# Patient Record
Sex: Female | Born: 1970 | Race: White | Hispanic: No | Marital: Married | Smoking: Never smoker
Health system: Southern US, Community
[De-identification: ages and names within clinical notes are randomized; demographics above are authoritative.]

## PROBLEM LIST (undated history)

## (undated) DIAGNOSIS — G43909 Migraine, unspecified, not intractable, without status migrainosus: Secondary | ICD-10-CM

## (undated) DIAGNOSIS — K219 Gastro-esophageal reflux disease without esophagitis: Secondary | ICD-10-CM

## (undated) DIAGNOSIS — N3010 Interstitial cystitis (chronic) without hematuria: Secondary | ICD-10-CM

## (undated) HISTORY — PX: TUBAL LIGATION: SHX77

## (undated) HISTORY — PX: CHOLECYSTECTOMY: SHX55

---

## 2006-01-20 ENCOUNTER — Ambulatory Visit: Payer: Self-pay | Admitting: Gastroenterology

## 2006-12-28 ENCOUNTER — Ambulatory Visit: Payer: Self-pay | Admitting: Obstetrics and Gynecology

## 2010-10-17 ENCOUNTER — Ambulatory Visit: Payer: Self-pay | Admitting: Otolaryngology

## 2012-04-12 ENCOUNTER — Ambulatory Visit: Payer: Self-pay | Admitting: Family Medicine

## 2014-05-28 ENCOUNTER — Ambulatory Visit: Payer: Self-pay | Admitting: Family Medicine

## 2015-03-17 ENCOUNTER — Ambulatory Visit
Admission: EM | Admit: 2015-03-17 | Discharge: 2015-03-17 | Disposition: A | Discharge status: Home / Self Care | Payer: BLUE CROSS/BLUE SHIELD | Attending: Family Medicine | Admitting: Family Medicine

## 2015-03-17 ENCOUNTER — Ambulatory Visit (INDEPENDENT_AMBULATORY_CARE_PROVIDER_SITE_OTHER): Payer: BLUE CROSS/BLUE SHIELD

## 2015-03-17 DIAGNOSIS — J40 Bronchitis, not specified as acute or chronic: Secondary | ICD-10-CM

## 2015-03-17 MED ORDER — ALBUTEROL SULFATE HFA 108 (90 BASE) MCG/ACT IN AERS: 1.0000 | INHALATION_SPRAY | Freq: Four times a day (QID) | RESPIRATORY_TRACT | Status: DC | PRN

## 2015-03-17 MED ORDER — HYDROCOD POLST-CPM POLST ER 10-8 MG/5ML PO SUER: 5.0000 mL | Freq: Two times a day (BID) | ORAL | Status: AC | PRN

## 2015-03-17 MED ORDER — PREDNISONE 20 MG PO TABS: ORAL_TABLET | ORAL | Status: DC

## 2015-03-17 NOTE — ED Notes (Signed)
X 1 week. Pt C/o productive cough with clear sputum, SOB, sore throat, H/A, laryngitis, low grade fever. Pt's PCP started pt on prescription cough syrup, Z Pack, and then Omnicef, without relief.

## 2015-03-17 NOTE — ED Provider Notes (Signed)
CSN: [id number]     Arrival date & time 03/17/15  0900 History   First MD Initiated Contact with Patient 03/17/15 1022     Chief Complaint  Patient presents with  . URI   (Consider location/radiation/quality/duration/timing/severity/associated sxs/prior Treatment) HPI patient presents today with symptoms of productive cough, hoarseness, sore throat, headache, low-grade fever. She has experienced some shortness of breath with the coughing. She has been on a Z-Pak. Most recently she started Omnicef 2 days ago. She denies any history of smoking, PE, DVT, cardiac problems. She denies any nausea, chest pain, severe headache, vomiting, diarrhea, abdominal pain. She is in no distress in the office today. She denies any history of COPD or asthma. She has been taking Advil, Pseudoephedrine, Cheratussin for her symptoms.  No past medical history on file. No past surgical history on file. No family history on file. Social History  Substance Use Topics  . Smoking status: Never Smoker   . Smokeless tobacco: Not on file  . Alcohol Use: No   OB History    No data available     Review of Systems: Negative except mentioned above.   Allergies  Review of patient's allergies indicates no known allergies.  Home Medications   Prior to Admission medications   Medication Sig Start Date End Date Taking? Authorizing Provider  amitriptyline (ELAVIL) 50 MG tablet Take 50 mg by mouth at bedtime.   Yes Historical Provider, MD  eletriptan (RELPAX) 40 MG tablet Take 40 mg by mouth as needed for migraine or headache. May repeat in 2 hours if headache persists or recurs.   Yes Historical Provider, MD  pantoprazole (PROTONIX) 20 MG tablet Take 20 mg by mouth daily.   Yes Historical Provider, MD  topiramate (TOPAMAX) 50 MG tablet Take 50 mg by mouth 2 (two) times daily.   Yes Historical Provider, MD   Meds Ordered and Administered this Visit  Medications - No data to display  BP 107/78 mmHg  Pulse 90   Temp(Src) 98.6 F (37 C) (Oral)  Resp 16  Ht 5\' 3"  (1.6 m)  Wt 207 lb (93.895 kg)  BMI 36.68 kg/m2  SpO2 100% No data found.   Physical Exam   GENERAL: NAD HEENT: no pharyngeal erythema, no exudate, no erythema of TMs, no cervical LAD RESP: CTA B, no wheezing, no rhonci  CARD: RRR NEURO: CN II-XII grossly intact   ED Course  Procedures (including critical care time)  Labs Review Labs Reviewed - No data to display  Imaging Review Dg Chest 2 View  03/17/2015  CLINICAL DATA:  44 year old female with cough and fever for 1 week. EXAM: CHEST  2 VIEW COMPARISON:  None. FINDINGS: The cardiomediastinal silhouette is unremarkable. Mild peribronchial thickening noted. There is no evidence of focal airspace disease, pulmonary edema, suspicious pulmonary nodule/mass, pleural effusion, or pneumothorax. No acute bony abnormalities are identified. Cholecystectomy clips identified. IMPRESSION: Mild peribronchial thickening of uncertain chronicity. No evidence of focal pneumonia. Electronically Signed   By: Harmon PierJeffrey  Hu M.D.   On: 03/17/2015 11:21       MDM    A/P: Bronchitis-chest x-ray as above, recommend continuing Omnicef since patient started this 2 days ago, albuterol when necessary, Tussionex when necessary, low-dose prednisone orally (patient has taken prednisone in the past and has had facial flushing and elevation of blood pressure so we will start with a low-dose prednisone). I have asked that she follow up with her primary care physician in the next couple of days so her  prednisone could be increased if needed. If she has any acute problems she is to seek immediate adequate attention as discussed. She can stop the Cheratussin and pseudoephedrine and Advil at this time. I've given her work excuse for 2 days.   Jolene Provost, MD 03/17/15 1150

## 2015-03-31 ENCOUNTER — Emergency Department
Admission: EM | Admit: 2015-03-31 | Discharge: 2015-03-31 | Disposition: A | Discharge status: Home / Self Care | Payer: BLUE CROSS/BLUE SHIELD | Attending: Emergency Medicine | Admitting: Emergency Medicine

## 2015-03-31 ENCOUNTER — Encounter: Payer: Self-pay | Admitting: Emergency Medicine

## 2015-03-31 DIAGNOSIS — G43809 Other migraine, not intractable, without status migrainosus: Secondary | ICD-10-CM | POA: Diagnosis not present

## 2015-03-31 DIAGNOSIS — Z79899 Other long term (current) drug therapy: Secondary | ICD-10-CM | POA: Diagnosis not present

## 2015-03-31 DIAGNOSIS — Z7952 Long term (current) use of systemic steroids: Secondary | ICD-10-CM | POA: Diagnosis not present

## 2015-03-31 DIAGNOSIS — R51 Headache: Secondary | ICD-10-CM | POA: Diagnosis present

## 2015-03-31 HISTORY — DX: Migraine, unspecified, not intractable, without status migrainosus: G43.909

## 2015-03-31 HISTORY — DX: Interstitial cystitis (chronic) without hematuria: N30.10

## 2015-03-31 HISTORY — DX: Gastro-esophageal reflux disease without esophagitis: K21.9

## 2015-03-31 MED ORDER — DEXAMETHASONE SODIUM PHOSPHATE 10 MG/ML IJ SOLN
10.0000 mg | Freq: Once | INTRAMUSCULAR | Status: AC
  Administered 2015-03-31: 10 mg via INTRAVENOUS
  Filled 2015-03-31: qty 1

## 2015-03-31 MED ORDER — METOCLOPRAMIDE HCL 5 MG/ML IJ SOLN
10.0000 mg | Freq: Once | INTRAMUSCULAR | Status: AC
  Administered 2015-03-31: 10 mg via INTRAVENOUS
  Filled 2015-03-31: qty 2

## 2015-03-31 MED ORDER — DIPHENHYDRAMINE HCL 50 MG/ML IJ SOLN
25.0000 mg | Freq: Once | INTRAMUSCULAR | Status: AC
  Administered 2015-03-31: 25 mg via INTRAVENOUS
  Filled 2015-03-31: qty 1

## 2015-03-31 MED ORDER — SODIUM CHLORIDE 0.9 % IV BOLUS (SEPSIS)
500.0000 mL | INTRAVENOUS | Status: AC
  Administered 2015-03-31: 500 mL via INTRAVENOUS

## 2015-03-31 MED ORDER — KETOROLAC TROMETHAMINE 30 MG/ML IJ SOLN
15.0000 mg | Freq: Once | INTRAMUSCULAR | Status: AC
  Administered 2015-03-31: 15 mg via INTRAVENOUS
  Filled 2015-03-31: qty 1

## 2015-03-31 NOTE — ED Provider Notes (Signed)
[facility] Provider Note  ____________________________________________  Time seen: Approximately 11:10 PM  I have reviewed the triage vital signs and the nursing notes.   HISTORY  Chief Complaint Migraine    HPI KLOE OATES is a 45 y.o. female who presents to the ED from home with a chief complaint of migraine headache. Patient has a history of migraine headaches, occurring approximately 5-6 times per month. Patient complains of typical migraine headache 3 days. Describes sharp and throbbing pain behind her right eye. She is taking her migraine medications as prescribed with little to no relief. Complains of associated symptoms of nausea and vomiting 2 as well as photophobia. Denies fever, chills, neck pain, chest pain, shortness of breath, abdominal pain, diarrhea. Denies recent travel or trauma. Nothing makes her headache better or worse.   Past Medical History  Diagnosis Date  . Migraine   . Interstitial cystitis   . GERD (gastroesophageal reflux disease)     There are no active problems to display for this patient.   Past Surgical History  Procedure Laterality Date  . Cholecystectomy    . Tubal ligation      Current Outpatient Rx  Name  Route  Sig  Dispense  Refill  . albuterol (PROVENTIL HFA;VENTOLIN HFA) 108 (90 BASE) MCG/ACT inhaler   Inhalation   Inhale 1-2 puffs into the lungs every 6 (six) hours as needed for wheezing or shortness of breath.   1 Inhaler   0   . amitriptyline (ELAVIL) 50 MG tablet   Oral   Take 50 mg by mouth at bedtime.         Marland Kitchen eletriptan (RELPAX) 40 MG tablet   Oral   Take 40 mg by mouth as needed for migraine or headache. May repeat in 2 hours if headache persists or recurs.         . pantoprazole (PROTONIX) 20 MG tablet   Oral   Take 20 mg by mouth daily.         . predniSONE (DELTASONE) 20 MG tablet      Take one tab PO qd for 5 days.   5 tablet   0   . topiramate  (TOPAMAX) 50 MG tablet   Oral   Take 50 mg by mouth 2 (two) times daily.           Allergies Review of patient's allergies indicates no known allergies.  History reviewed. No pertinent family history.  Social History Social History  Substance Use Topics  . Smoking status: Never Smoker   . Smokeless tobacco: None  . Alcohol Use: No    Review of Systems Constitutional: No fever/chills Eyes: No visual changes. ENT: No sore throat. Cardiovascular: Denies chest pain. Respiratory: Denies shortness of breath. Gastrointestinal: No abdominal pain.  No nausea, no vomiting.  No diarrhea.  No constipation. Genitourinary: Negative for dysuria. Musculoskeletal: Negative for back pain. Skin: Negative for rash. Neurological: Positive for headache. Negative for focal weakness or numbness.  10-point ROS otherwise negative.  ____________________________________________   PHYSICAL EXAM:  VITAL SIGNS: ED Triage Vitals  Enc Vitals Group     BP 03/31/15 2125 142/90 mmHg     Pulse Rate 03/31/15 2125 92     Resp 03/31/15 2125 18     Temp 03/31/15 2125 98.3 F (36.8 C)     Temp Source 03/31/15 2125 Oral     SpO2 03/31/15 2125 100 %     Weight 03/31/15 2125 208 lb (94.348  kg)     Height 03/31/15 2125 5\' 3"  (1.6 m)     Head Cir --      Peak Flow --      Pain Score 03/31/15 2126 10     Pain Loc --      Pain Edu? --      Excl. in GC? --     Constitutional: Alert and oriented. Well appearing and in no acute distress. Eyes: Conjunctivae are normal. PERRL. EOMI. Head: Atraumatic. Nose: No congestion/rhinnorhea. Mouth/Throat: Mucous membranes are moist.  Oropharynx non-erythematous. Neck: No stridor.  No carotid bruits. Neck is supple without evidence for meningismus. Cardiovascular: Normal rate, regular rhythm. Grossly normal heart sounds.  Good peripheral circulation. Respiratory: Normal respiratory effort.  No retractions. Lungs CTAB. Gastrointestinal: Soft and nontender. No  distention. No abdominal bruits. No CVA tenderness. Musculoskeletal: No lower extremity tenderness nor edema.  No joint effusions. Neurologic:  Alert and oriented 4. CN II-XII grossly intact. Normal speech and language. No gross focal neurologic deficits are appreciated. No gait instability. Skin:  Skin is warm, dry and intact. No rash noted. Psychiatric: Mood and affect are normal. Speech and behavior are normal.  ____________________________________________   LABS (all labs ordered are listed, but only abnormal results are displayed)  Labs Reviewed - No data to display ____________________________________________  EKG  None ____________________________________________  RADIOLOGY  None ____________________________________________   PROCEDURES  Procedure(s) performed: None  Critical Care performed: No  ____________________________________________   INITIAL IMPRESSION / ASSESSMENT AND PLAN / ED COURSE  Pertinent labs & imaging results that were available during my care of the patient were reviewed by me and considered in my medical decision making (see chart for details).  45 year old female with recurrent migraine headaches who presents with typical migraine headache. She received an IV headache cocktail prior to my arrival with good relief. Pain has decreased to 3/10. She exhibits no focal neurological deficits on my exam. Strict return precautions given. Patient's spouse verbalize understanding and agree with plan of care. ____________________________________________   FINAL CLINICAL IMPRESSION(S) / ED DIAGNOSES  Final diagnoses:  Other migraine without status migrainosus, not intractable      Irean HongJade J Demian Maisel, MD 04/01/15 [phone]

## 2015-03-31 NOTE — Discharge Instructions (Signed)
1. Continue daily medicines as directed by your doctor. 2. Return to the ER for worsening symptoms, persistent vomiting, lethargy or other concerns.  Recurrent Migraine Headache A migraine headache is an intense, throbbing pain on one or both sides of your head. Recurrent migraines keep coming back. A migraine can last for 30 minutes to several hours. CAUSES  The exact cause of a migraine headache is not always known. However, a migraine may be caused when nerves in the brain become irritated and release chemicals that cause inflammation. This causes pain. Certain things may also trigger migraines, such as:   Alcohol.  Smoking.  Stress.  Menstruation.  Aged cheeses.  Foods or drinks that contain nitrates, glutamate, aspartame, or tyramine.  Lack of sleep.  Chocolate.  Caffeine.  Hunger.  Physical exertion.  Fatigue.  Medicines used to treat chest pain (nitroglycerine), birth control pills, estrogen, and some blood pressure medicines. SYMPTOMS   Pain on one or both sides of your head.  Pulsating or throbbing pain.  Severe pain that prevents daily activities.  Pain that is aggravated by any physical activity.  Nausea, vomiting, or both.  Dizziness.  Pain with exposure to bright lights, loud noises, or activity.  General sensitivity to bright lights, loud noises, or smells. Before you get a migraine, you may get warning signs that a migraine is coming (aura). An aura may include:  Seeing flashing lights.  Seeing bright spots, halos, or zigzag lines.  Having tunnel vision or blurred vision.  Having feelings of numbness or tingling.  Having trouble talking.  Having muscle weakness. DIAGNOSIS  A recurrent migraine headache is often diagnosed based on:  Symptoms.  Physical examination.  A CT scan or MRI of your head. These imaging tests cannot diagnose migraines but can help rule out other causes of headaches.  TREATMENT  Medicines may be given for  pain and nausea. Medicines can also be given to help prevent recurrent migraines. HOME CARE INSTRUCTIONS  Only take over-the-counter or prescription medicines for pain or discomfort as directed by your health care provider. The use of long-term narcotics is not recommended.  Lie down in a dark, quiet room when you have a migraine.  Keep a journal to find out what may trigger your migraine headaches. For example, write down:  What you eat and drink.  How much sleep you get.  Any change to your diet or medicines.  Limit alcohol consumption.  Quit smoking if you smoke.  Get 7-9 hours of sleep, or as recommended by your health care provider.  Limit stress.  Keep lights dim if bright lights bother you and make your migraines worse. SEEK MEDICAL CARE IF:   You do not get relief from the medicines given to you.  You have a recurrence of pain.  You have a fever. SEEK IMMEDIATE MEDICAL CARE IF:  Your migraine becomes severe.  You have a stiff neck.  You have loss of vision.  You have muscular weakness or loss of muscle control.  You start losing your balance or have trouble walking.  You feel faint or pass out.  You have severe symptoms that are different from your first symptoms. MAKE SURE YOU:   Understand these instructions.  Will watch your condition.  Will get help right away if you are not doing well or get worse.   This information is not intended to replace advice given to you by your health care provider. Make sure you discuss any questions you have with your health  care provider.   Document Released: 12/09/2000 Document Revised: 04/06/2014 Document Reviewed: 11/21/2012 Elsevier Interactive Patient Education [employer]2016 Elsevier Inc.

## 2015-03-31 NOTE — ED Notes (Signed)
Pt moved to subwait for IV medication and to wait for treatment room; husband at side; callbell in reach;

## 2015-03-31 NOTE — ED Notes (Addendum)
Pt c/o migraine headache since Friday, behind right eye; pt says she usually gets her migraines behind either eye; taking migraine medications as prescribed with little to no relief; N/V with last time 30 minutes ago; sensitive to lights and sounds

## 2016-05-21 ENCOUNTER — Ambulatory Visit (INDEPENDENT_AMBULATORY_CARE_PROVIDER_SITE_OTHER): Payer: Worker's Compensation | Admitting: Family Medicine

## 2016-05-21 VITALS — BP 122/80 | HR 87 | Temp 98.1°F | Resp 18 | Ht 63.0 in | Wt 214.6 lb

## 2016-05-21 DIAGNOSIS — S0993XA Unspecified injury of face, initial encounter: Secondary | ICD-10-CM | POA: Diagnosis not present

## 2016-05-21 DIAGNOSIS — Z026 Encounter for examination for insurance purposes: Secondary | ICD-10-CM

## 2016-05-21 MED ORDER — NAPROXEN 500 MG PO TABS: 500.0000 mg | ORAL_TABLET | Freq: Two times a day (BID) | ORAL | 0 refills | Status: DC

## 2016-05-21 NOTE — Patient Instructions (Addendum)
Apply ice to chin in 10-20 minutes increments as tolerated.  Take Naproxen 500 mg twice daily as needed with food for pain.  If symptoms worsen, return for care.

## 2016-05-21 NOTE — Progress Notes (Signed)
     Patricia Modenaeri I Costa 08/13/1970 46 y.o.   Chief Complaint  Patient presents with  . Fall    w/c intial. Chin injury     Presents for evaluation of work-related complaint.  Date of Injury: 05/21/16 1030   History of Present Illness: 46 year old female presents for evaluation of injury to the chin which occurred at her workplace: [employer]. Reports injury occurred as follows: Walking outside on walkway between buildings and tripped and fell hitting her chin on uneven pavement. Denies loosing consciousness, light-headedness, headache, or visual disturbances. Reports during injury she bit down on her bottom inner lip slightly causing mild bleeding.  She has not taken any medication. She has applied ice to reduce pain and swelling.  ROS See HPI Current medications and allergies reviewed and updated. Past medical history, family history, social history have been reviewed and updated.  Physical Exam  Constitutional: She is oriented to person, place, and time.  HENT:  Inner lower lip bite mark laceration  No injury to tongue  Cardiovascular: Normal rate, regular rhythm, normal heart sounds and intact distal pulses.   Pulmonary/Chest: Effort normal and breath sounds normal.  Neurological: She is alert and oriented to person, place, and time.     Assessment and Plan:  1. Chin injury, initial encounter 2. Encounter related to worker's compensation claim  Plan: Continue to apply ice as tolerated to reduce swelling and pain. Take Naproxen 500 mg twice daily with food as needed for pain.

## 2017-06-30 ENCOUNTER — Other Ambulatory Visit: Payer: Self-pay | Admitting: Family Medicine

## 2017-06-30 ENCOUNTER — Ambulatory Visit
Admission: RE | Admit: 2017-06-30 | Discharge: 2017-06-30 | Disposition: A | Discharge status: Home / Self Care | Payer: BLUE CROSS/BLUE SHIELD | Source: Ambulatory Visit | Attending: Family Medicine | Admitting: Family Medicine

## 2017-06-30 DIAGNOSIS — M25562 Pain in left knee: Secondary | ICD-10-CM | POA: Diagnosis present

## 2017-07-13 ENCOUNTER — Ambulatory Visit: Payer: BLUE CROSS/BLUE SHIELD | Admitting: Podiatry

## 2017-07-13 ENCOUNTER — Other Ambulatory Visit: Payer: Self-pay | Admitting: Podiatry

## 2017-07-13 ENCOUNTER — Ambulatory Visit (INDEPENDENT_AMBULATORY_CARE_PROVIDER_SITE_OTHER): Payer: BLUE CROSS/BLUE SHIELD

## 2017-07-13 DIAGNOSIS — M722 Plantar fascial fibromatosis: Secondary | ICD-10-CM

## 2017-07-13 DIAGNOSIS — M779 Enthesopathy, unspecified: Secondary | ICD-10-CM | POA: Diagnosis not present

## 2017-07-13 DIAGNOSIS — M7750 Other enthesopathy of unspecified foot: Secondary | ICD-10-CM

## 2017-07-13 DIAGNOSIS — M79672 Pain in left foot: Secondary | ICD-10-CM

## 2017-07-13 MED ORDER — MELOXICAM 7.5 MG PO TABS: 7.5000 mg | ORAL_TABLET | Freq: Every day | ORAL | 0 refills | Status: DC

## 2017-07-13 NOTE — Patient Instructions (Signed)

## 2017-07-14 NOTE — Progress Notes (Signed)
Subjective:   Patient ID: Patricia Costa, female   DOB: 47 y.o.   MRN: [id number]   HPI 47 year old female presents the office today for concerns of bilateral foot pain in the left foot worse than the right.  She says is been ongoing for several years been getting worse over the last couple of months.  She gets pain to the foot in the morning she first gets up or after activity.  Pain is intermittent but is on a daily basis.  She denies any recent injury or trauma.  She has previously been treated by our office for plantar fasciitis.  She denies any recent injury or trauma to the area.  She is been taking ibuprofen.  She is a remote history of fracturing both feet about 20 years ago.  She has no other concerns today.   Review of Systems  All other systems reviewed and are negative.  Past Medical History:  Diagnosis Date  . GERD (gastroesophageal reflux disease)   . Interstitial cystitis   . Migraine     Past Surgical History:  Procedure Laterality Date  . CHOLECYSTECTOMY    . TUBAL LIGATION       Current Outpatient Medications:  .  albuterol (PROVENTIL HFA;VENTOLIN HFA) 108 (90 BASE) MCG/ACT inhaler, Inhale 1-2 puffs into the lungs every 6 (six) hours as needed for wheezing or shortness of breath., Disp: 1 Inhaler, Rfl: 0 .  amitriptyline (ELAVIL) 50 MG tablet, Take 50 mg by mouth at bedtime., Disp: , Rfl:  .  eletriptan (RELPAX) 40 MG tablet, Take 40 mg by mouth as needed for migraine or headache. May repeat in 2 hours if headache persists or recurs., Disp: , Rfl:  .  naproxen (NAPROSYN) 500 MG tablet, Take 1 tablet (500 mg total) by mouth 2 (two) times daily with a meal., Disp: 30 tablet, Rfl: 0 .  omeprazole (PRILOSEC) 40 MG capsule, Take 40 mg by mouth daily., Disp: , Rfl:  .  topiramate (TOPAMAX) 50 MG tablet, Take 50 mg by mouth 2 (two) times daily., Disp: , Rfl:  .  meloxicam (MOBIC) 7.5 MG tablet, Take 1 tablet (7.5 mg total) by mouth daily., Disp: 30 tablet, Rfl: 0  No Known  Allergies  Social History   Socioeconomic History  . Marital status: Married    Spouse name: Not on file  . Number of children: Not on file  . Years of education: Not on file  . Highest education level: Not on file  Occupational History  . Not on file  Social Needs  . Financial resource strain: Not on file  . Food insecurity:    Worry: Not on file    Inability: Not on file  . Transportation needs:    Medical: Not on file    Non-medical: Not on file  Tobacco Use  . Smoking status: Never Smoker  . Smokeless tobacco: Never Used  Substance and Sexual Activity  . Alcohol use: No  . Drug use: Not on file  . Sexual activity: Not on file  Lifestyle  . Physical activity:    Days per week: Not on file    Minutes per session: Not on file  . Stress: Not on file  Relationships  . Social connections:    Talks on phone: Not on file    Gets together: Not on file    Attends religious service: Not on file    Active member of club or organization: Not on file    Attends meetings  of clubs or organizations: Not on file    Relationship status: Not on file  . Intimate partner violence:    Fear of current or ex partner: Not on file    Emotionally abused: Not on file    Physically abused: Not on file    Forced sexual activity: Not on file  Other Topics Concern  . Not on file  Social History Narrative  . Not on file        Objective:  Physical Exam  General: AAO x3, NAD  Dermatological: Skin is warm, dry and supple bilateral. Nails x 10 are well manicured; remaining integument appears unremarkable at this time. There are no open sores, no preulcerative lesions, no rash or signs of infection present.  Vascular: Dorsalis Pedis artery and Posterior Tibial artery pedal pulses are 2/4 bilateral with immedate capillary fill time. Pedal hair growth present. No varicosities and no lower extremity edema present bilateral. There is no pain with calf compression, swelling, warmth, erythema.    Neruologic: Grossly intact via light touch bilateral. Vibratory intact via tuning fork bilateral. Protective threshold with Semmes Wienstein monofilament intact to all pedal sites bilateral. Patellar and Achilles deep tendon reflexes 2+ bilateral. No Babinski or clonus noted bilateral.   Musculoskeletal: At this time there is no tenderness palpation of the plantar medial tubercle of the calcaneus and insertion of the plantar fascia bilaterally.  On the left side there is mild discomfort on the course of the flexor, posterior tibial tendon.  She is able to do a single and double heel rise without any issues.  There is also mild discomfort more to the dorsal lateral aspect of the left foot and ankle.  There is no pain along the ATFL, CFL or PTFL.  Minimal discomfort on the sinus tarsi.  Mild decrease in medial arch upon weightbearing.  Mild tenderness on the dorsal aspect of the midfoot on the right side but there is no specific area pinpoint bony tenderness or pain to vibratory sensation.  Achilles tendon appears to be intact.  Muscular strength 5/5 in all groups tested bilateral.  Gait: Unassisted, Nonantalgic.       Assessment:   47 year old female with bilateral tendinitis, capsulitis left side worse than the right    Plan:  -Treatment options discussed including all alternatives, risks, and complications -Etiology of symptoms were discussed -X-rays were obtained and reviewed with the patient.  No definitive evidence of acute fracture or stress fracture.  Mild arthritic changes present in the midfoot on the right side on the lateral view. -Prescribed mobic. Discussed side effects of the medication and directed to stop if any are to occur and call the office.  -Stretching, icing exercises daily. -We discussed a custom molded orthotic.  I will have her reappointed to see Raiford Noble to get molded for orthotics. -Discussed shoe modifications  Vivi Barrack DPM

## 2017-07-20 ENCOUNTER — Telehealth: Payer: Self-pay | Admitting: Podiatry

## 2017-07-20 NOTE — Telephone Encounter (Signed)
lvm for pt that [employer] is having system updates for the last 2 days and cannot give me coverage information and I am out of the office as of tomorrow afternoon and will call next week to verify benefits.

## 2017-07-26 NOTE — Telephone Encounter (Signed)
Per Valentina Gu @ [employer] [phone]) are covered at 100% if billed with office visit and copay is paid..  Left message for pt to call to confirm she wants to proceed with ordering the orthotics.Marland Kitchen

## 2017-07-27 NOTE — Telephone Encounter (Signed)
Pt called and benefits for orthotics given to pt and she would like to proceed with ordering them.

## 2017-07-27 NOTE — Telephone Encounter (Signed)
Thanks, can you please make sure the charges are entered? Thank you.

## 2017-08-12 ENCOUNTER — Encounter: Payer: Self-pay | Admitting: Podiatry

## 2017-08-12 ENCOUNTER — Ambulatory Visit: Payer: BLUE CROSS/BLUE SHIELD | Admitting: Podiatry

## 2017-08-12 DIAGNOSIS — M722 Plantar fascial fibromatosis: Secondary | ICD-10-CM | POA: Diagnosis not present

## 2017-08-12 MED ORDER — METHYLPREDNISOLONE 4 MG PO TBPK: ORAL_TABLET | ORAL | 0 refills | Status: DC

## 2017-08-12 NOTE — Patient Instructions (Signed)

## 2017-08-13 DIAGNOSIS — M722 Plantar fascial fibromatosis: Secondary | ICD-10-CM | POA: Insufficient documentation

## 2017-08-13 NOTE — Progress Notes (Signed)
Subjective: 47 year old female presents the office today for follow-up evaluation of left foot plantar fasciitis and to pick up orthotics.  She states that her foot is doing okay but she still getting tenderness in the heels like the wound when she gets up or after activity.  Denies any recent injury or trauma.  She did have one day of swelling last week with a resolved.  No redness or warmth.  No recent injury. Denies any systemic complaints such as fevers, chills, nausea, vomiting. No acute changes since last appointment, and no other complaints at this time.   Objective: AAO x3, NAD DP/PT pulses palpable bilaterally, CRT less than 3 seconds There is continuation of tenderness to palpation on the plantar medial tubercle of the calcaneus at the insertion of plantar fascia on the left side.  There is no pain on the course of the plantar fascia today.  There is no pain on the posterior aspect of the calcaneus or along the Achilles tendon.  There is no pain to the ankle.  Flexor, extensor tendons appear to be intact.  Achilles tendon intact.  There is no significant edema, erythema, increase in warmth identified today. No open lesions or pre-ulcerative lesions.  No pain with calf compression, swelling, warmth, erythema  Assessment: Left foot plantar fasciitis  Plan: -All treatment options discussed with the patient including all alternatives, risks, complications.  -The orthotics are ready but upon evaluation of your fitting into her tennis shoes which she states she does not wear with anyone for address pair purulence in the spectrum of a dress orthotic made for her.  I dispensed a plantar fascial brace today.  Medrol Dosepak was prescribed.  Continue stretching, icing exercises daily.  Declines steroid injection. -Patient encouraged to call the office with any questions, concerns, change in symptoms.   Vivi Barrack DPM

## 2017-09-02 ENCOUNTER — Ambulatory Visit: Payer: BLUE CROSS/BLUE SHIELD | Admitting: Orthotics

## 2017-09-02 DIAGNOSIS — M722 Plantar fascial fibromatosis: Secondary | ICD-10-CM

## 2017-09-02 DIAGNOSIS — M779 Enthesopathy, unspecified: Secondary | ICD-10-CM

## 2017-09-02 NOTE — Progress Notes (Signed)
Patient came in today to pick up custom made foot orthotics.  The goals were accomplished and the patient reported no dissatisfaction with said orthotics.  Patient was advised of breakin period and how to report any issues. 

## 2018-01-25 ENCOUNTER — Other Ambulatory Visit: Payer: Self-pay | Admitting: Family Medicine

## 2018-01-25 DIAGNOSIS — Z1231 Encounter for screening mammogram for malignant neoplasm of breast: Secondary | ICD-10-CM

## 2018-02-08 ENCOUNTER — Ambulatory Visit
Admission: RE | Admit: 2018-02-08 | Discharge: 2018-02-08 | Disposition: A | Discharge status: Home / Self Care | Payer: BLUE CROSS/BLUE SHIELD | Source: Ambulatory Visit | Attending: Family Medicine | Admitting: Family Medicine

## 2018-02-08 DIAGNOSIS — Z1231 Encounter for screening mammogram for malignant neoplasm of breast: Secondary | ICD-10-CM | POA: Insufficient documentation

## 2019-03-21 ENCOUNTER — Other Ambulatory Visit: Payer: Self-pay | Admitting: Family Medicine

## 2019-03-21 DIAGNOSIS — Z1231 Encounter for screening mammogram for malignant neoplasm of breast: Secondary | ICD-10-CM

## 2019-03-29 ENCOUNTER — Ambulatory Visit
Admission: RE | Admit: 2019-03-29 | Discharge: 2019-03-29 | Disposition: A | Discharge status: Home / Self Care | Payer: BC Managed Care – PPO | Source: Ambulatory Visit | Attending: Family Medicine | Admitting: Family Medicine

## 2019-03-29 DIAGNOSIS — Z1231 Encounter for screening mammogram for malignant neoplasm of breast: Secondary | ICD-10-CM | POA: Insufficient documentation

## 2020-04-02 ENCOUNTER — Other Ambulatory Visit: Payer: Self-pay | Admitting: Family Medicine

## 2020-04-02 DIAGNOSIS — Z1231 Encounter for screening mammogram for malignant neoplasm of breast: Secondary | ICD-10-CM

## 2020-04-05 ENCOUNTER — Other Ambulatory Visit: Payer: Self-pay

## 2020-04-05 ENCOUNTER — Ambulatory Visit
Admission: RE | Admit: 2020-04-05 | Discharge: 2020-04-05 | Disposition: A | Discharge status: Home / Self Care | Payer: BC Managed Care – PPO | Source: Ambulatory Visit | Attending: Family Medicine | Admitting: Family Medicine

## 2020-04-05 DIAGNOSIS — Z1231 Encounter for screening mammogram for malignant neoplasm of breast: Secondary | ICD-10-CM | POA: Insufficient documentation

## 2022-02-17 IMAGING — MG DIGITAL SCREENING BILAT W/ TOMO W/ CAD
8 series · 8 of 24 positions shown · non-contrast
Comparison: Previous exam(s).

ACR Breast Density Category a: The breast tissue is almost entirely
fatty.

CLINICAL DATA: Screening.

EXAM:
DIGITAL SCREENING BILATERAL MAMMOGRAM WITH TOMO AND CAD

[L CC synth-2D]
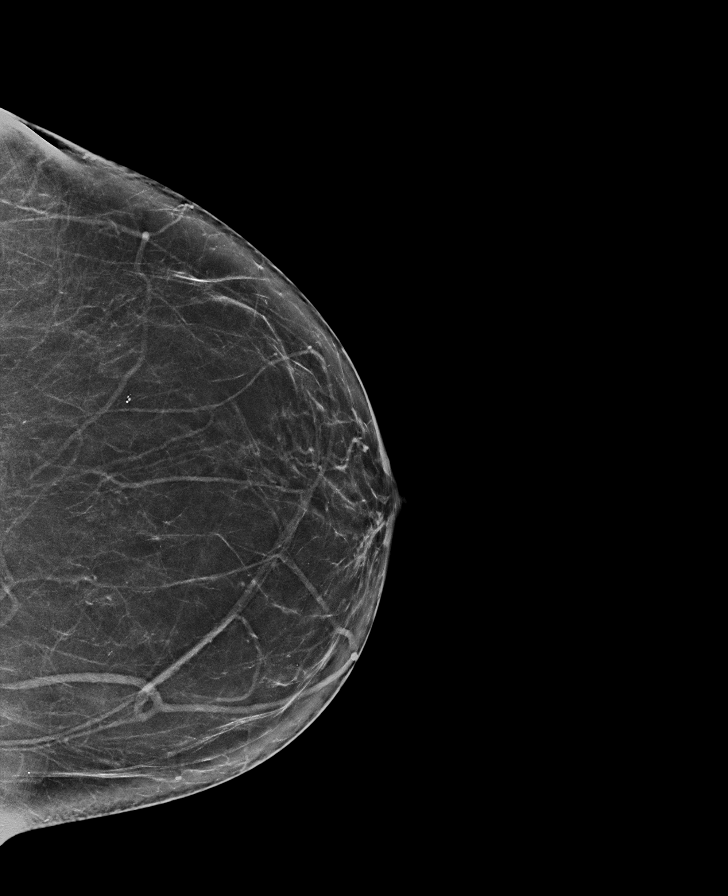

[R MLO synth-2D]
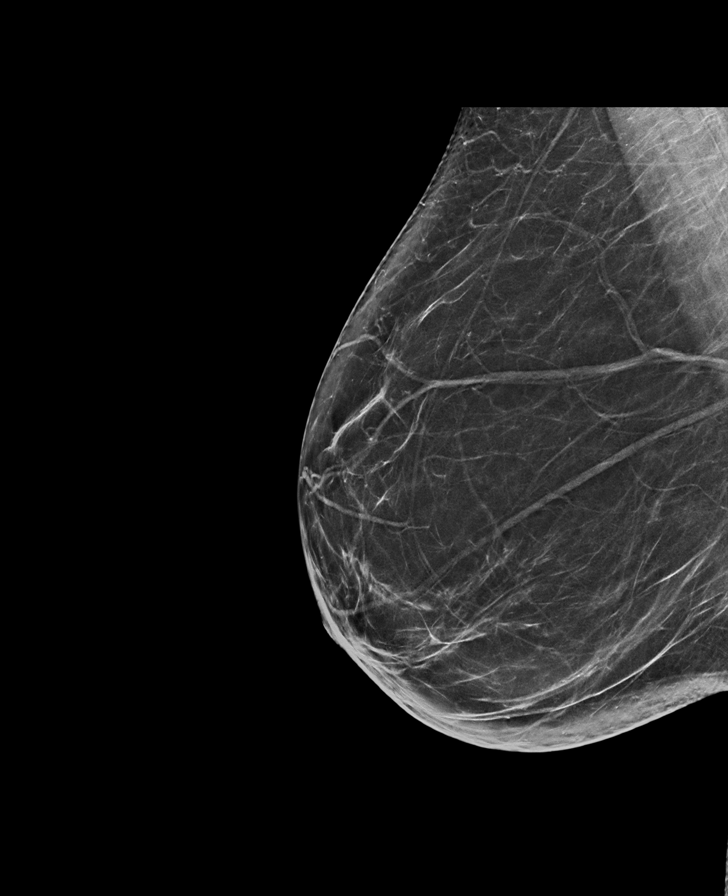

[L MLO synth-2D]
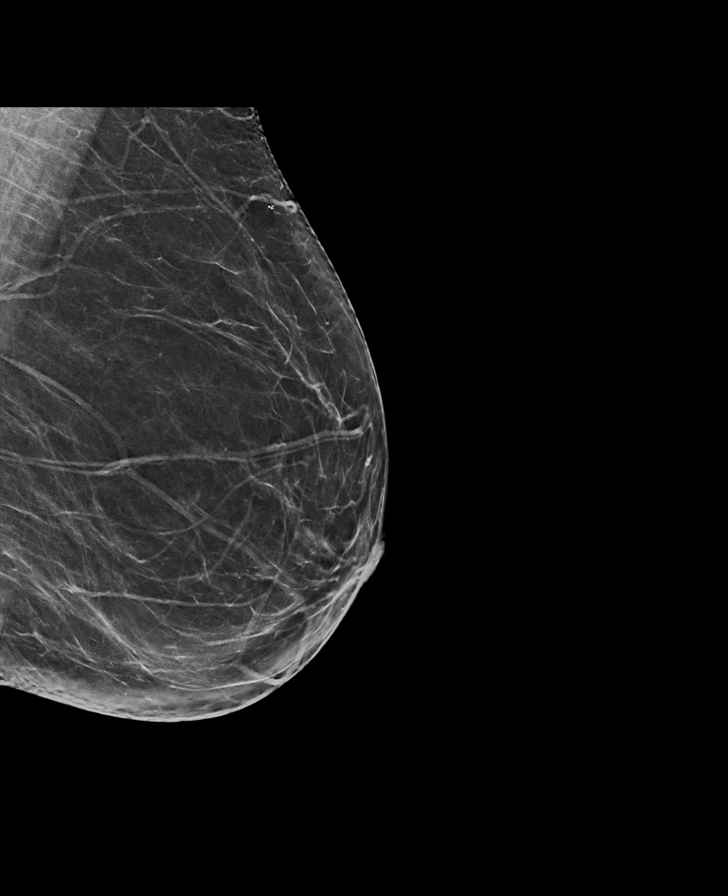

[R CC synth-2D]
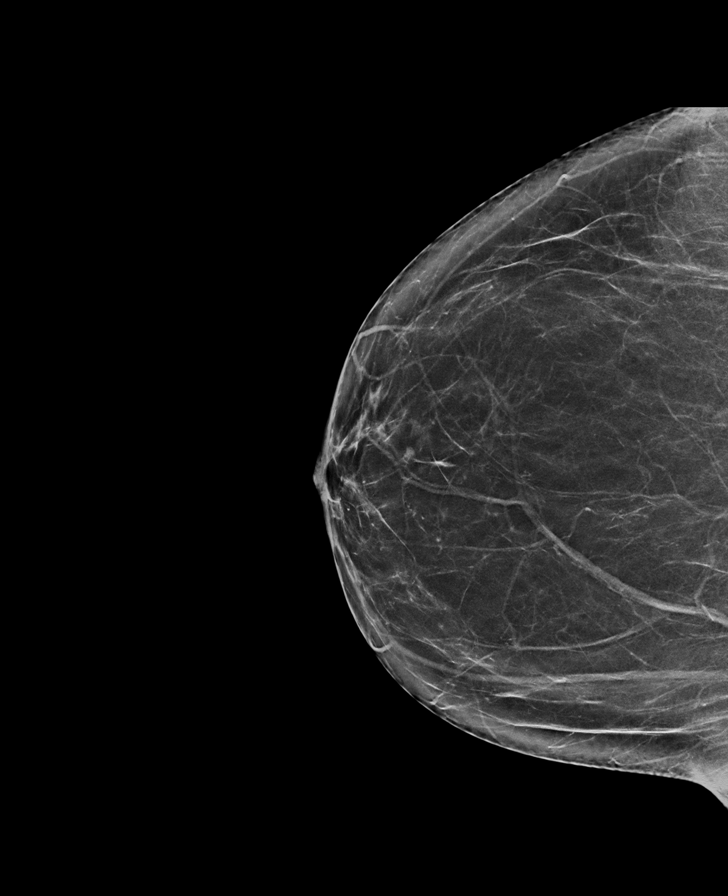

[R CC tomo · tomo slice 37/72.0]
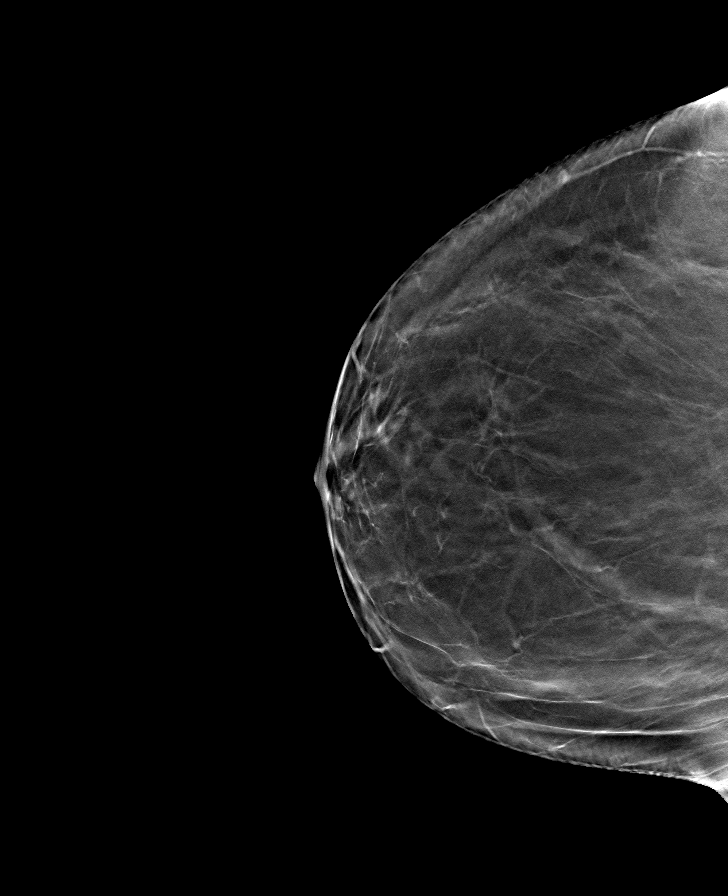

[L CC tomo · tomo slice 37/72.0]
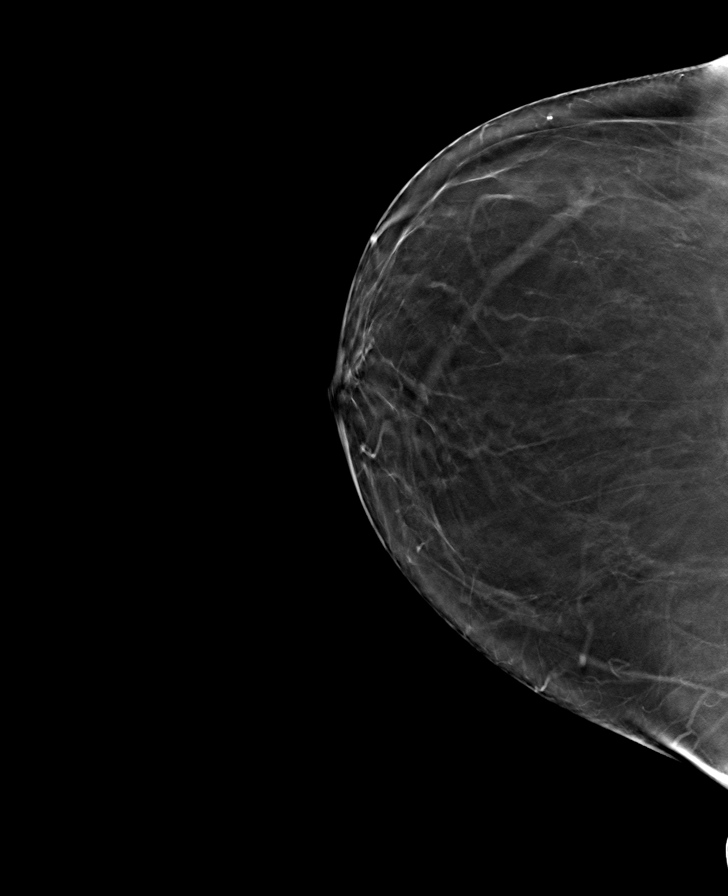

[R MLO tomo · tomo slice 38/75.0]
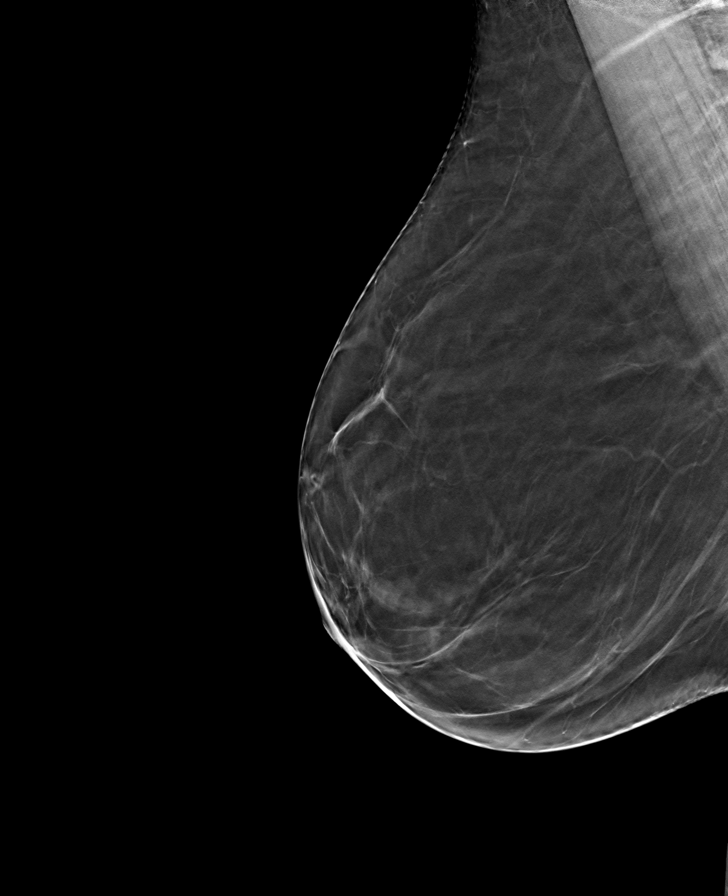

[L MLO tomo · tomo slice 37/74.0]
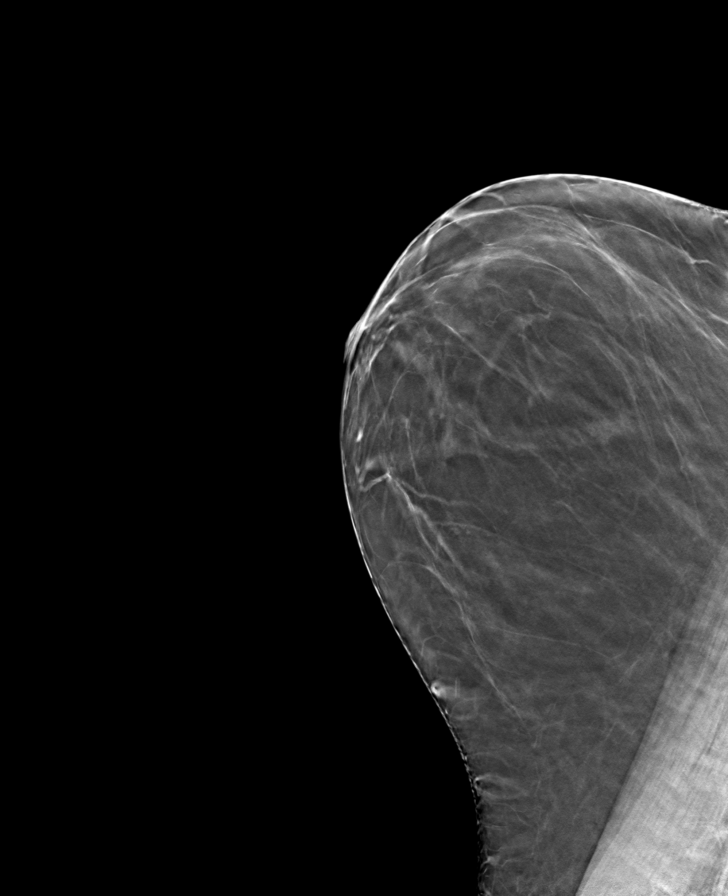

[8 of 24 positions shown; findings below may reference images not displayed]

FINDINGS: There are no findings suspicious for malignancy. Images were
processed with CAD.
IMPRESSION: No mammographic evidence of malignancy. A result letter of this
screening mammogram will be mailed directly to the patient.

RECOMMENDATION:
Screening mammogram in one year. (Code:[id number])

BI-RADS CATEGORY  1: Negative.

## 2022-05-14 ENCOUNTER — Other Ambulatory Visit: Payer: Self-pay | Admitting: Family Medicine

## 2022-05-14 DIAGNOSIS — Z1231 Encounter for screening mammogram for malignant neoplasm of breast: Secondary | ICD-10-CM

## 2022-06-03 ENCOUNTER — Ambulatory Visit
Admission: RE | Admit: 2022-06-03 | Discharge: 2022-06-03 | Disposition: A | Discharge status: Home / Self Care | Payer: BC Managed Care – PPO | Source: Ambulatory Visit | Attending: Family Medicine | Admitting: Family Medicine

## 2022-06-03 DIAGNOSIS — Z1231 Encounter for screening mammogram for malignant neoplasm of breast: Secondary | ICD-10-CM

## 2022-06-05 ENCOUNTER — Encounter: Payer: Self-pay | Admitting: Family Medicine

## 2022-06-08 ENCOUNTER — Other Ambulatory Visit: Payer: Self-pay | Admitting: Family Medicine

## 2022-06-08 DIAGNOSIS — R928 Other abnormal and inconclusive findings on diagnostic imaging of breast: Secondary | ICD-10-CM

## 2022-06-08 DIAGNOSIS — N63 Unspecified lump in unspecified breast: Secondary | ICD-10-CM

## 2022-06-10 ENCOUNTER — Ambulatory Visit
Admission: RE | Admit: 2022-06-10 | Discharge: 2022-06-10 | Disposition: A | Discharge status: Home / Self Care | Payer: BC Managed Care – PPO | Source: Ambulatory Visit | Attending: Family Medicine | Admitting: Family Medicine

## 2022-06-10 DIAGNOSIS — R928 Other abnormal and inconclusive findings on diagnostic imaging of breast: Secondary | ICD-10-CM | POA: Insufficient documentation

## 2022-06-10 DIAGNOSIS — N63 Unspecified lump in unspecified breast: Secondary | ICD-10-CM | POA: Diagnosis present

## 2022-06-11 ENCOUNTER — Encounter: Payer: Self-pay | Admitting: Family Medicine

## 2022-06-15 ENCOUNTER — Other Ambulatory Visit: Payer: Self-pay | Admitting: Family Medicine

## 2022-06-15 DIAGNOSIS — N63 Unspecified lump in unspecified breast: Secondary | ICD-10-CM

## 2022-06-15 DIAGNOSIS — R928 Other abnormal and inconclusive findings on diagnostic imaging of breast: Secondary | ICD-10-CM

## 2022-06-17 ENCOUNTER — Ambulatory Visit
Admission: RE | Admit: 2022-06-17 | Discharge: 2022-06-17 | Disposition: A | Discharge status: Home / Self Care | Payer: BC Managed Care – PPO | Source: Ambulatory Visit | Attending: Family Medicine | Admitting: Family Medicine

## 2022-06-17 DIAGNOSIS — N63 Unspecified lump in unspecified breast: Secondary | ICD-10-CM | POA: Insufficient documentation

## 2022-06-17 DIAGNOSIS — R928 Other abnormal and inconclusive findings on diagnostic imaging of breast: Secondary | ICD-10-CM

## 2022-06-17 HISTORY — PX: BREAST BIOPSY: SHX20

## 2022-06-17 MED ORDER — LIDOCAINE HCL (PF) 1 % IJ SOLN
10.0000 mL | Freq: Once | INTRAMUSCULAR | Status: AC
  Administered 2022-06-17: 10 mL
  Filled 2022-06-17: qty 10

## 2022-06-18 LAB — SURGICAL PATHOLOGY

## 2023-03-12 ENCOUNTER — Telehealth: Payer: BC Managed Care – PPO | Admitting: Physician Assistant

## 2023-03-12 DIAGNOSIS — B9689 Other specified bacterial agents as the cause of diseases classified elsewhere: Secondary | ICD-10-CM

## 2023-03-12 DIAGNOSIS — J019 Acute sinusitis, unspecified: Secondary | ICD-10-CM

## 2023-03-12 MED ORDER — DOXYCYCLINE HYCLATE 100 MG PO TABS: 100.0000 mg | ORAL_TABLET | Freq: Two times a day (BID) | ORAL | 0 refills | Status: AC

## 2023-03-12 NOTE — Progress Notes (Signed)

## 2023-06-10 ENCOUNTER — Other Ambulatory Visit: Payer: Self-pay | Admitting: Family Medicine

## 2023-06-10 DIAGNOSIS — Z1231 Encounter for screening mammogram for malignant neoplasm of breast: Secondary | ICD-10-CM

## 2023-06-18 ENCOUNTER — Ambulatory Visit
Admission: RE | Admit: 2023-06-18 | Discharge: 2023-06-18 | Disposition: A | Discharge status: Home / Self Care | Source: Ambulatory Visit | Attending: Family Medicine | Admitting: Family Medicine

## 2023-06-18 DIAGNOSIS — Z1231 Encounter for screening mammogram for malignant neoplasm of breast: Secondary | ICD-10-CM | POA: Insufficient documentation

## 2023-08-16 ENCOUNTER — Ambulatory Visit: Admitting: Dermatology

## 2023-08-16 ENCOUNTER — Encounter: Payer: Self-pay | Admitting: Dermatology

## 2023-08-16 VITALS — BP 120/76 | HR 82

## 2023-08-16 DIAGNOSIS — L814 Other melanin hyperpigmentation: Secondary | ICD-10-CM | POA: Diagnosis not present

## 2023-08-16 DIAGNOSIS — L821 Other seborrheic keratosis: Secondary | ICD-10-CM

## 2023-08-16 DIAGNOSIS — L918 Other hypertrophic disorders of the skin: Secondary | ICD-10-CM

## 2023-08-16 DIAGNOSIS — D1801 Hemangioma of skin and subcutaneous tissue: Secondary | ICD-10-CM

## 2023-08-16 DIAGNOSIS — W908XXA Exposure to other nonionizing radiation, initial encounter: Secondary | ICD-10-CM

## 2023-08-16 DIAGNOSIS — Z1283 Encounter for screening for malignant neoplasm of skin: Secondary | ICD-10-CM | POA: Diagnosis not present

## 2023-08-16 DIAGNOSIS — L578 Other skin changes due to chronic exposure to nonionizing radiation: Secondary | ICD-10-CM | POA: Diagnosis not present

## 2023-08-16 DIAGNOSIS — D229 Melanocytic nevi, unspecified: Secondary | ICD-10-CM

## 2023-08-16 NOTE — Patient Instructions (Addendum)
 Cosmetic skin tag removal with dr. Myrtie Atkinson   Skin Education : We counseled the patient regarding the following: Sun screen (SPF 30 or greater) should be applied during peak UV exposure (between 10am and 2pm) and reapplied after exercise or swimming.  The ABCDEs of melanoma were reviewed with the patient, and the importance of monthly self-examination of moles was emphasized. Should any moles change in shape or color, or itch, bleed or burn, pt will contact our office for evaluation sooner then their interval appointment.  Plan: Sunscreen Recommendations We recommended a broad spectrum sunscreen with a SPF of 30 or higher. I SPF 30 sunscreens block approximately 97 percent of the sun's harmful rays. Sunscreens should be applied at least 15 minutes prior to expected sun exposure and then every 2 hours after that as long as sun exposure continues. If swimming or exercising sunscreen should be reapplied every 45 minutes to an hour after getting wet or sweating. One ounce, or the equivalent of a shot glass full of sunscreen, is adequate to protect the skin not covered by a bathing suit. We also recommended a lip balm with a sunscreen as well. Sun protective clothing can be used in lieu of sunscreen but must be worn the entire time you are exposed to the sun's rays.   Important Information   Due to recent changes in healthcare laws, you may see results of your pathology and/or laboratory studies on MyChart before the doctors have had a chance to review them. We understand that in some cases there may be results that are confusing or concerning to you. Please understand that not all results are received at the same time and often the doctors may need to interpret multiple results in order to provide you with the best plan of care or course of treatment. Therefore, we ask that you please give us  2 business days to thoroughly review all your results before contacting the office for clarification. Should we see a  critical lab result, you will be contacted sooner.     If You Need Anything After Your Visit   If you have any questions or concerns for your doctor, please call our main line at [phone]. If no one answers, please leave a voicemail as directed and we will return your call as soon as possible. Messages left after 4 pm will be answered the following business day.    You may also send us  a message via MyChart. We typically respond to MyChart messages within 1-2 business days.  For prescription refills, please ask your pharmacy to contact our office. Our fax number is [phone].  If you have an urgent issue when the clinic is closed that cannot wait until the next business day, you can page your doctor at the number below.     Please note that while we do our best to be available for urgent issues outside of office hours, we are not available 24/7.    If you have an urgent issue and are unable to reach us , you may choose to seek medical care at your doctor's office, retail clinic, urgent care center, or emergency room.   If you have a medical emergency, please immediately call 911 or go to the emergency department. In the event of inclement weather, please call our main line at [phone] for an update on the status of any delays or closures.  Dermatology Medication Tips: Please keep the boxes that topical medications come in in order to help keep track of the  instructions about where and how to use these. Pharmacies typically print the medication instructions only on the boxes and not directly on the medication tubes.   If your medication is too expensive, please contact our office at [phone] or send us  a message through MyChart.    We are unable to tell what your co-pay for medications will be in advance as this is different depending on your insurance coverage. However, we may be able to find a substitute medication at lower cost or fill out paperwork to get insurance to cover  a needed medication.    If a prior authorization is required to get your medication covered by your insurance company, please allow us  1-2 business days to complete this process.   Drug prices often vary depending on where the prescription is filled and some pharmacies may offer cheaper prices.   The website www.goodrx.com contains coupons for medications through different pharmacies. The prices here do not account for what the cost may be with help from insurance (it may be cheaper with your insurance), but the website can give you the price if you did not use any insurance.  - You can print the associated coupon and take it with your prescription to the pharmacy.  - You may also stop by our office during regular business hours and pick up a GoodRx coupon card.  - If you need your prescription sent electronically to a different pharmacy, notify our office through [facility] or by phone at [phone]

## 2023-08-16 NOTE — Progress Notes (Signed)
   New Patient Visit   Subjective  Patricia Costa is a 53 y.o. female who presents for the following: Skin Cancer Screening and Full Body Skin Exam  The patient presents for Total-Body Skin Exam (TBSE) for skin cancer screening and mole check. The patient has spots, moles and lesions to be evaluated, some may be new or changing.  The following portions of the chart were reviewed this encounter and updated as appropriate: medications, allergies, medical history  Review of Systems:  No other skin or systemic complaints except as noted in HPI or Assessment and Plan.  Objective  Well appearing patient in no apparent distress; mood and affect are within normal limits.  A full examination was performed including scalp, head, eyes, ears, nose, lips, neck, chest, axillae, abdomen, back, buttocks, bilateral upper extremities, bilateral lower extremities, hands, feet, fingers, toes, fingernails, and toenails. All findings within normal limits unless otherwise noted below.   Relevant physical exam findings are noted in the Assessment and Plan.   Assessment & Plan   SKIN CANCER SCREENING PERFORMED TODAY.  ACTINIC DAMAGE - Chronic condition, secondary to cumulative UV/sun exposure - diffuse scaly erythematous macules with underlying dyspigmentation - Recommend daily broad spectrum sunscreen SPF 30+ to sun-exposed areas, reapply every 2 hours as needed.  - Staying in the shade or wearing long sleeves, sun glasses (UVA+UVB protection) and wide brim hats (4-inch brim around the entire circumference of the hat) are also recommended for sun protection.  - Call for new or changing lesions.  MELANOCYTIC NEVI - Tan-brown and/or pink-flesh-colored symmetric macules and papules - Benign appearing on exam today - Observation - Call clinic for new or changing moles - Recommend daily use of broad spectrum spf 30+ sunscreen to sun-exposed areas.   LENTIGINES Exam: scattered tan macules Due to sun  exposure Treatment Plan: Benign-appearing, observe. Recommend daily broad spectrum sunscreen SPF 30+ to sun-exposed areas, reapply every 2 hours as needed.  Call for any changes   HEMANGIOMA Exam: red papule(s) Discussed benign nature. Recommend observation. Call for changes.   SEBORRHEIC KERATOSIS - Stuck-on, waxy, tan-brown papules and/or plaques  - Benign-appearing - Discussed benign etiology and prognosis. - Observe - Call for any changes  Acrochordons (Skin Tags) under bra line and on neck - Fleshy, skin-colored pedunculated papules - Benign appearing.  - Observe. - If desired, they can be removed with an in office procedure that is not covered by insurance. - Please call the clinic if you notice any new or changing lesions.    Discussed cosmetic removal $200 for up to 20    Return in about 1 year (around 08/15/2024) for TBSE.  I, Wilson Hasten, CMA, am acting as scribe for Deneise Finlay, MD.   Documentation: I have reviewed the above documentation for accuracy and completeness, and I agree with the above.  Deneise Finlay, MD
